# Patient Record
Sex: Female | Born: 1965 | Race: White | Hispanic: No | Marital: Married
Health system: Southern US, Community
[De-identification: ages and names within clinical notes are randomized; demographics above are authoritative.]

## PROBLEM LIST (undated history)

## (undated) DIAGNOSIS — N83209 Unspecified ovarian cyst, unspecified side: Secondary | ICD-10-CM

## (undated) HISTORY — DX: Unspecified ovarian cyst, unspecified side: N83.209

## (undated) HISTORY — PX: APPENDECTOMY: SHX54

---

## 1998-02-06 ENCOUNTER — Ambulatory Visit (HOSPITAL_COMMUNITY): Admission: RE | Admit: 1998-02-06 | Discharge: 1998-02-06 | Payer: Self-pay | Admitting: Obstetrics and Gynecology

## 1998-03-21 ENCOUNTER — Other Ambulatory Visit: Admission: RE | Admit: 1998-03-21 | Discharge: 1998-03-21 | Payer: Self-pay | Admitting: Obstetrics & Gynecology

## 1998-05-12 ENCOUNTER — Inpatient Hospital Stay (HOSPITAL_COMMUNITY): Admission: AD | Admit: 1998-05-12 | Discharge: 1998-05-14 | Payer: Self-pay | Admitting: Obstetrics and Gynecology

## 1999-01-11 ENCOUNTER — Other Ambulatory Visit: Admission: RE | Admit: 1999-01-11 | Discharge: 1999-01-11 | Payer: Self-pay | Admitting: Obstetrics and Gynecology

## 2001-12-04 ENCOUNTER — Other Ambulatory Visit: Admission: RE | Admit: 2001-12-04 | Discharge: 2001-12-04 | Payer: Self-pay | Admitting: Family Medicine

## 2003-03-28 ENCOUNTER — Ambulatory Visit (HOSPITAL_COMMUNITY): Admission: RE | Admit: 2003-03-28 | Discharge: 2003-03-28 | Payer: Self-pay | Admitting: Family Medicine

## 2003-03-28 ENCOUNTER — Encounter: Payer: Self-pay | Admitting: Family Medicine

## 2003-12-19 ENCOUNTER — Inpatient Hospital Stay (HOSPITAL_COMMUNITY): Admission: EM | Admit: 2003-12-19 | Discharge: 2003-12-21 | Payer: Self-pay | Admitting: *Deleted

## 2004-08-29 ENCOUNTER — Ambulatory Visit (HOSPITAL_COMMUNITY): Admission: RE | Admit: 2004-08-29 | Discharge: 2004-08-29 | Payer: Self-pay | Admitting: Unknown Physician Specialty

## 2005-04-03 ENCOUNTER — Ambulatory Visit (HOSPITAL_COMMUNITY): Admission: RE | Admit: 2005-04-03 | Discharge: 2005-04-03 | Payer: Self-pay | Admitting: Family Medicine

## 2005-06-06 ENCOUNTER — Ambulatory Visit: Payer: Self-pay | Admitting: Family Medicine

## 2006-07-13 ENCOUNTER — Emergency Department (HOSPITAL_COMMUNITY): Admission: EM | Admit: 2006-07-13 | Discharge: 2006-07-13 | Payer: Self-pay | Admitting: Emergency Medicine

## 2007-09-29 ENCOUNTER — Emergency Department (HOSPITAL_COMMUNITY): Admission: EM | Admit: 2007-09-29 | Discharge: 2007-09-30 | Payer: Self-pay | Admitting: Emergency Medicine

## 2009-02-16 ENCOUNTER — Ambulatory Visit (HOSPITAL_COMMUNITY): Admission: RE | Admit: 2009-02-16 | Discharge: 2009-02-16 | Payer: Self-pay | Admitting: Obstetrics & Gynecology

## 2009-09-10 ENCOUNTER — Emergency Department (HOSPITAL_COMMUNITY): Admission: EM | Admit: 2009-09-10 | Discharge: 2009-09-10 | Payer: Self-pay | Admitting: Emergency Medicine

## 2010-11-16 ENCOUNTER — Ambulatory Visit (HOSPITAL_COMMUNITY): Admission: RE | Admit: 2010-11-16 | Discharge: 2010-11-16 | Payer: Self-pay | Admitting: Obstetrics and Gynecology

## 2011-01-06 ENCOUNTER — Encounter: Payer: Self-pay | Admitting: Family Medicine

## 2011-01-06 ENCOUNTER — Encounter: Payer: Self-pay | Admitting: General Surgery

## 2011-03-22 LAB — DIFFERENTIAL
Basophils Absolute: 0 10*3/uL (ref 0.0–0.1)
Eosinophils Relative: 2 % (ref 0–5)
Lymphocytes Relative: 31 % (ref 12–46)
Monocytes Absolute: 0.4 10*3/uL (ref 0.1–1.0)

## 2011-03-22 LAB — COMPREHENSIVE METABOLIC PANEL
AST: 26 U/L (ref 0–37)
Albumin: 4.3 g/dL (ref 3.5–5.2)
Alkaline Phosphatase: 44 U/L (ref 39–117)
Chloride: 105 mEq/L (ref 96–112)
Creatinine, Ser: 0.68 mg/dL (ref 0.4–1.2)
GFR calc Af Amer: >60 mL/min (ref >60)
Potassium: 3.7 mEq/L (ref 3.5–5.1)
Sodium: 139 mEq/L (ref 135–145)
Total Bilirubin: 0.7 mg/dL (ref 0.3–1.2)

## 2011-03-22 LAB — URINE MICROSCOPIC-ADD ON

## 2011-03-22 LAB — URINALYSIS, ROUTINE W REFLEX MICROSCOPIC
Glucose, UA: NEGATIVE mg/dL
Hgb urine dipstick: NEGATIVE
Specific Gravity, Urine: 1.01 (ref 1.005–1.030)

## 2011-03-22 LAB — CBC
Platelets: 247 10*3/uL (ref 150–400)
WBC: 5.9 10*3/uL (ref 4.0–10.5)

## 2011-05-03 NOTE — H&P (Signed)
Brooke Reed, Brooke Reed                        ACCOUNT NO.:  [id number]   MEDICAL RECORD NO.:  [id number]                   PATIENT TYPE:  EMS   LOCATION:  ED                                   FACILITY:  [facility]   PHYSICIAN:  Barbaraann Barthel, M.D.              DATE OF BIRTH:  25-Jun-1966   DATE OF ADMISSION:  12/18/2003  DATE OF DISCHARGE:                                HISTORY & PHYSICAL   REASON FOR ADMISSION:  Note, surgery was asked to see this 45 year old white  female for abdominal pain.   CHIEF COMPLAINT:  Abdominal pain, epigastric and right lower quadrant with  nausea and vomiting.   HISTORY OF PRESENT ILLNESS:  The patient states that she was feeling fine  until about 6:00 this afternoon when she developed some discomfort in her  right inner epigastrium that later localized to her right lower quadrant.  This was later accompanied with nausea and vomiting. As this pain did not  abate after she took some Pepto-Bismol, and she continued to have right  lower quadrant pain and she came to the emergency room. She was seen and  evaluated. She was noted on CT scan to have changes suggestive of acute  appendicitis. Surgery was consulted and responded immediately.   PHYSICAL EXAMINATION:  GENERAL:  A pleasant, 45 year old white female who is  uncomfortable, but in no acute distress.  VITAL SIGNS:  She is approximately 5 feet 4 inches and weighs 110 pounds.  Temperature was 97.6, blood pressure 149/74, heart rate 77 per minute and  respirations 20 per minute.  HEENT:  Head is normocephalic.  Eyes - Extraocular movements are intact.  Pupils are round and reactive to light and accommodation. There is no  conjunctive pallor or scleral injection. Sclerae is normal.  Nose is normal.  Mucosa is moist.  NECK:  Supple and cylindrical without jugular vein distention, thyromegaly  or tracheal deviation. There are no bruit auscultated and there is no  adenopathy.  LUNGS:  Chest is clear, both  anteriorly and posteriorly to auscultation.  CARDIAC:  Heart regular rhythm.  BREASTS/AXILLAE:  Without masses.  ABDOMEN:  Bowel sounds are diminished. The patient is tender in her  epigastrium and primarily in the right lower quadrant with localized rebound  tenderness there. There are no femoral or inguinal herniae appreciated.  RECTAL:  Examination is not performed. The patient is currently having her  menses and there is blood around her anal area, so it would not be helpful  for guaiac testing.  EXTREMITIES:  Within normal limits.   REVIEW OF SYMPTOMS:  GI:  Nausea, vomiting and anorexia. No past history of  changes in her bowel movements, no history of bright red rectal bleeding or  black tarry stools. No history of inflammatory bowel disease or hepatitis.  No history of unexplained weight loss.  GU:  No history of dysuria, no history of nephrolithiasis.  CARDIORESPIRATORY:  The  patient smokes a pack of cigarettes per day. She  states she had some chest pain, atypical, associated with putting together a  game station - she states that she has never had any other recurrent  episodes of this.  An EKG was done in the emergency room and this was normal. The patient has  no chest pain or shortness of breath. This appears to be an atypical kind of  story related to the anxieties of Christmas activities. ENDOCRINE:  No  history of diabetes or thyroid disease.  OB/GYN:  She is a gravida 1, para  1, abortus 0, cesarean 0 female whose is currently menstruating. Her  paternal grandmother had breast carcinoma. MUSCULOSKELETAL:  Within normal  limits.   LABORATORY AND ACCESSORY DATA:  The patient has a white count of 13,000 with  an H&H of 11.4 and 33.1 with a left shift.  Electrolytes are all grossly  within normal limits; blood sugar is 120, BUN is 5.0 and liver function  studies are all within normal limits. Amylase and lipase are not elevated,  and urine HCG is negative.   CT scan  shows changes consistent with acute appendicitis. Urinalysis shows  blood - large amounts, this was however, as stated, this patient is  menstruating.  EKG is normal.   IMPRESSION:  Acute appendicitis.   PLAN:  We will continue hydration and have started antibiotics IV, and we  will plan for an exploratory laparotomy and an appendectomy as soon as  possible. We discussed this in detail with the patient and her mother, and  informed consent was obtained. We discussed complications not limited to but  including bleeding, infection and leaking from her appendiceal stump. All  questions were answered.   The patient takes not medications on a regular basis and has no known  allergies, although, she did develop some pruritus when given Dilaudid in  the emergency room.  Other than that, there is no positive medical history.     ___________________________________________                                         Barbaraann Barthel, M.D.   WB/MEDQ  D:  12/19/2003  T:  12/19/2003  Job:  [id number]   cc:   Dr. Mosetta Putt, Emergency Room

## 2011-05-03 NOTE — Discharge Summary (Signed)
Brooke Reed, Brooke Reed                        ACCOUNT NO.:  [id number]   MEDICAL RECORD NO.:  [id number]                   PATIENT TYPE:  INP   LOCATION:  A302                                 FACILITY:  [facility]   PHYSICIAN:  Barbaraann Barthel, M.D.              DATE OF BIRTH:  1966-06-11   DATE OF ADMISSION:  12/18/2003  DATE OF DISCHARGE:  12/21/2003                                 DISCHARGE SUMMARY   DIAGNOSIS:  Acute appendicitis.   PROCEDURE:  On December 19, 2003, open appendectomy.   NOTE:  This is a 45 year old white female who was seen in the emergency room  with right lower quadrant pain, nausea and vomiting.  Her CT scan was  positive for acute appendicitis.  She was taken to surgery after hydration  and antibiotic therapy was initiated and an appendectomy was performed.   GROSS OPERATIVE FINDINGS:  The patient had those consistent with acute  suppurative nonperforated appendicitis.  The rest of the right lower  quadrant appeared to be normal.   HOSPITAL COURSE:  Completely uneventful.  The patient's diet and activity  were advanced as tolerated.  She did have very heavy menses from  postoperative hydration.  Her H&H did drop, however on follow-up this  remained stable.  Her H&H was 8.3 and 24.8 following surgery and repeat H&H  was 9.1 and 27.2 and her blood pressure and vital signs all remained stable.   At time of discharge she was tolerating p.o. well, her wound was clean  without sign of infection, her abdomen was soft and she was voiding well.  She has no leg pain or shortness of breath or chest pain.   DISCHARGE INSTRUCTIONS:  1. Darvocet N-100 one tab p.o. q.4 hours p.r.n. pain.  2. Cipro 500 mg p.o. b.i.d.  3. She was told to clean her wound with alcohol three times a day and avoid     taking any Ex-Lax or harsh cathartics.  4. She is excused from work.  5. She is told to do no heavy lifting or driving or sexual activity in the     immediate postoperative  period.  6. Her diet is to be a soft diet and full liquids.  7. We will follow her perioperatively.  8. She was told to contact us should there be any acute changes.     ___________________________________________                                         Barbaraann Barthel, M.D.   WB/MEDQ  D:  12/21/2003  T:  12/21/2003  Job:  [id number]

## 2011-05-03 NOTE — Op Note (Signed)
NAMEJENAFER, WINTERTON                        ACCOUNT NO.:  [id number]   MEDICAL RECORD NO.:  [id number]                   PATIENT TYPE:  EMS   LOCATION:  ED                                   FACILITY:  [facility]   PHYSICIAN:  Barbaraann Barthel, M.D.              DATE OF BIRTH:  [birth year]   DATE OF PROCEDURE:  12/19/2003  DATE OF DISCHARGE:                                 OPERATIVE REPORT   PREOPERATIVE DIAGNOSIS:  Acute appendicitis.   POSTOPERATIVE DIAGNOSIS:  Acute appendicitis.   PROCEDURE:  Open appendectomy.   SURGEON:  Barbaraann Barthel, M.D.   SPECIMEN:  Appendix.   NOTE:  This is a 45 year old, white female who came to the emergency room  with signs and symptoms of acute appendicitis.  This was confirmed by a CT  scan as well.  She was taken to surgery.  Prior to surgery we discussed  complications, not limited to, but including: Bleeding, infection, and  appendiceal stump leak.  Informed consent was obtained.   GROSS OPERATIVE FINDINGS:  Those consistent with an acute suppurative  nonperforated appendicitis.  The rest of the right lower quadrant was  normal.   TECHNIQUE:  The patient was placed in the supine position and after the  adequate administration of general anesthesia via endotracheal intubation  his entire abdomen was prepped with Betadine solution and draped in the  usual manner.  A Foley catheter was aseptically inserted.  A small  transverse incision was carried out in the right lower quadrant.  The skin,  subcutaneous tissue, and the fascia was divided.  The rectus muscle was  retracted medially.  The peritoneum was grasped and opened.  The cecum was  delivered into the wound and the mesoappendix was clamped and ligated with 2-  0 silk and the appendix was amputated with a TA-30 stapling device.  After  checking for hemostasis, we then changed gloves; irrigated the right lower  quadrant and closed the peritoneum with a running #0 Vicryl and the fascia  with  a figure-of-eight #0 Vicryl.   We irrigated the subcu and closed the skin with a stapling device.  Prior to  closure, all sponge, needle, and instrument counts were found to be correct.  Estimated blood loss was minimal.  The patient received 1100 cc of  crystalloids intraoperatively.  No drains were placed; and there were no  complications.     ___________________________________________                                            Barbaraann Barthel, M.D.   WB/MEDQ  D:  12/19/2003  T:  12/19/2003  Job:  [id number]   cc:   Emergency Department

## 2011-08-16 ENCOUNTER — Encounter: Payer: Self-pay | Admitting: Internal Medicine

## 2011-08-21 ENCOUNTER — Other Ambulatory Visit: Payer: Self-pay | Admitting: Obstetrics and Gynecology

## 2011-08-21 DIAGNOSIS — N6459 Other signs and symptoms in breast: Secondary | ICD-10-CM

## 2011-08-21 DIAGNOSIS — N6321 Unspecified lump in the left breast, upper outer quadrant: Secondary | ICD-10-CM

## 2011-08-23 ENCOUNTER — Other Ambulatory Visit: Payer: Self-pay

## 2011-09-06 ENCOUNTER — Ambulatory Visit: Payer: Self-pay | Admitting: Internal Medicine

## 2011-09-26 LAB — BASIC METABOLIC PANEL
CO2: 25
Calcium: 9.4
Chloride: 108
Creatinine, Ser: 0.77
Glucose, Bld: 106 — ABNORMAL HIGH

## 2011-09-26 LAB — PREGNANCY, URINE: Preg Test, Ur: NEGATIVE

## 2011-09-26 LAB — DIFFERENTIAL
Basophils Absolute: 0
Basophils Relative: 0
Eosinophils Absolute: 0.1
Monocytes Absolute: 0.5
Neutro Abs: 5.7
Neutrophils Relative %: 74

## 2011-09-26 LAB — POCT CARDIAC MARKERS
CKMB, poc: <1 — ABNORMAL LOW
Myoglobin, poc: 24.4
Myoglobin, poc: 24.8
Myoglobin, poc: 28.9
Operator id: 106841
Troponin i, poc: <0.05

## 2011-09-26 LAB — CBC
Hemoglobin: 11.3 — ABNORMAL LOW
MCHC: 34.3
MCV: 92
RDW: 13.6

## 2011-09-26 LAB — RAPID URINE DRUG SCREEN, HOSP PERFORMED
Amphetamines: NOT DETECTED
Barbiturates: NOT DETECTED
Benzodiazepines: NOT DETECTED
Cocaine: NOT DETECTED

## 2012-01-17 ENCOUNTER — Ambulatory Visit
Admission: RE | Admit: 2012-01-17 | Discharge: 2012-01-17 | Disposition: A | Discharge status: Home / Self Care | Payer: BC Managed Care – PPO | Source: Ambulatory Visit | Attending: Obstetrics and Gynecology | Admitting: Obstetrics and Gynecology

## 2012-01-17 ENCOUNTER — Other Ambulatory Visit: Payer: Self-pay | Admitting: Obstetrics and Gynecology

## 2012-01-17 DIAGNOSIS — N6321 Unspecified lump in the left breast, upper outer quadrant: Secondary | ICD-10-CM

## 2012-01-17 DIAGNOSIS — N6459 Other signs and symptoms in breast: Secondary | ICD-10-CM

## 2012-02-05 ENCOUNTER — Ambulatory Visit
Admission: RE | Admit: 2012-02-05 | Discharge: 2012-02-05 | Disposition: A | Discharge status: Home / Self Care | Payer: BC Managed Care – PPO | Source: Ambulatory Visit | Attending: Obstetrics and Gynecology | Admitting: Obstetrics and Gynecology

## 2012-02-05 DIAGNOSIS — N6321 Unspecified lump in the left breast, upper outer quadrant: Secondary | ICD-10-CM

## 2012-02-05 DIAGNOSIS — N6459 Other signs and symptoms in breast: Secondary | ICD-10-CM

## 2012-10-13 ENCOUNTER — Other Ambulatory Visit: Payer: Self-pay | Admitting: Family Medicine

## 2012-10-13 DIAGNOSIS — Z1231 Encounter for screening mammogram for malignant neoplasm of breast: Secondary | ICD-10-CM

## 2012-10-15 ENCOUNTER — Ambulatory Visit: Payer: BC Managed Care – PPO | Attending: Family | Admitting: Physical Therapy

## 2013-01-18 ENCOUNTER — Ambulatory Visit: Payer: BC Managed Care – PPO

## 2013-05-03 ENCOUNTER — Ambulatory Visit: Payer: BC Managed Care – PPO

## 2013-08-24 ENCOUNTER — Other Ambulatory Visit: Payer: Self-pay

## 2013-08-24 DIAGNOSIS — Z1231 Encounter for screening mammogram for malignant neoplasm of breast: Secondary | ICD-10-CM

## 2013-09-13 ENCOUNTER — Ambulatory Visit
Admission: RE | Admit: 2013-09-13 | Discharge: 2013-09-13 | Disposition: A | Discharge status: Home / Self Care | Payer: BC Managed Care – PPO | Source: Ambulatory Visit

## 2013-09-13 DIAGNOSIS — Z1231 Encounter for screening mammogram for malignant neoplasm of breast: Secondary | ICD-10-CM

## 2013-12-02 ENCOUNTER — Other Ambulatory Visit: Payer: Self-pay | Admitting: Obstetrics and Gynecology

## 2013-12-02 DIAGNOSIS — N6001 Solitary cyst of right breast: Secondary | ICD-10-CM

## 2013-12-02 DIAGNOSIS — N644 Mastodynia: Secondary | ICD-10-CM

## 2013-12-10 ENCOUNTER — Other Ambulatory Visit: Payer: BC Managed Care – PPO

## 2013-12-20 ENCOUNTER — Ambulatory Visit
Admission: RE | Admit: 2013-12-20 | Discharge: 2013-12-20 | Disposition: A | Discharge status: Home / Self Care | Payer: BC Managed Care – PPO | Source: Ambulatory Visit | Attending: Obstetrics and Gynecology | Admitting: Obstetrics and Gynecology

## 2013-12-20 DIAGNOSIS — N644 Mastodynia: Secondary | ICD-10-CM

## 2013-12-20 DIAGNOSIS — N6001 Solitary cyst of right breast: Secondary | ICD-10-CM

## 2014-05-10 ENCOUNTER — Other Ambulatory Visit (HOSPITAL_BASED_OUTPATIENT_CLINIC_OR_DEPARTMENT_OTHER): Payer: Self-pay | Admitting: Family Medicine

## 2014-05-10 DIAGNOSIS — R1011 Right upper quadrant pain: Secondary | ICD-10-CM

## 2014-05-11 ENCOUNTER — Ambulatory Visit (HOSPITAL_BASED_OUTPATIENT_CLINIC_OR_DEPARTMENT_OTHER)
Admission: RE | Admit: 2014-05-11 | Discharge: 2014-05-11 | Disposition: A | Discharge status: Home / Self Care | Payer: BC Managed Care – PPO | Source: Ambulatory Visit | Attending: Family Medicine | Admitting: Family Medicine

## 2014-05-11 DIAGNOSIS — R1011 Right upper quadrant pain: Secondary | ICD-10-CM | POA: Insufficient documentation

## 2014-05-11 DIAGNOSIS — R110 Nausea: Secondary | ICD-10-CM | POA: Insufficient documentation

## 2014-05-17 ENCOUNTER — Other Ambulatory Visit (HOSPITAL_BASED_OUTPATIENT_CLINIC_OR_DEPARTMENT_OTHER): Payer: Self-pay | Admitting: Family Medicine

## 2014-05-17 DIAGNOSIS — R1011 Right upper quadrant pain: Secondary | ICD-10-CM

## 2014-05-18 ENCOUNTER — Ambulatory Visit (HOSPITAL_BASED_OUTPATIENT_CLINIC_OR_DEPARTMENT_OTHER)
Admission: RE | Admit: 2014-05-18 | Discharge: 2014-05-18 | Disposition: A | Discharge status: Home / Self Care | Payer: BC Managed Care – PPO | Source: Ambulatory Visit | Attending: Family Medicine | Admitting: Family Medicine

## 2014-05-18 DIAGNOSIS — R1011 Right upper quadrant pain: Secondary | ICD-10-CM | POA: Insufficient documentation

## 2014-05-18 MED ORDER — IOHEXOL 300 MG/ML  SOLN
100.0000 mL | Freq: Once | INTRAMUSCULAR | Status: AC | PRN
  Administered 2014-05-18: 100 mL via INTRAVENOUS

## 2014-06-01 ENCOUNTER — Other Ambulatory Visit (HOSPITAL_COMMUNITY): Payer: Self-pay | Admitting: Gastroenterology

## 2014-06-01 DIAGNOSIS — R52 Pain, unspecified: Secondary | ICD-10-CM

## 2014-06-08 ENCOUNTER — Encounter (HOSPITAL_COMMUNITY)
Admission: RE | Admit: 2014-06-08 | Discharge: 2014-06-08 | Disposition: A | Discharge status: Home / Self Care | Payer: BC Managed Care – PPO | Source: Ambulatory Visit | Attending: Gastroenterology | Admitting: Gastroenterology

## 2014-06-08 DIAGNOSIS — R52 Pain, unspecified: Secondary | ICD-10-CM | POA: Insufficient documentation

## 2014-06-08 MED ORDER — TECHNETIUM TC 99M MEBROFENIN IV KIT
5.0000 | PACK | Freq: Once | INTRAVENOUS | Status: AC | PRN
  Administered 2014-06-08: 5 via INTRAVENOUS

## 2014-06-08 MED ORDER — SINCALIDE 5 MCG IJ SOLR
0.0200 ug/kg | Freq: Once | INTRAMUSCULAR | Status: DC
  Administered 2014-06-08: 1.02 ug via INTRAVENOUS

## 2014-06-08 MED ORDER — SINCALIDE 5 MCG IJ SOLR
INTRAMUSCULAR | Status: AC
  Administered 2014-06-08: 1.02 ug via INTRAVENOUS
  Filled 2014-06-08: qty 5

## 2014-06-20 ENCOUNTER — Ambulatory Visit (HOSPITAL_COMMUNITY): Payer: BC Managed Care – PPO

## 2015-02-01 ENCOUNTER — Other Ambulatory Visit: Payer: Self-pay

## 2015-02-01 DIAGNOSIS — Z1231 Encounter for screening mammogram for malignant neoplasm of breast: Secondary | ICD-10-CM

## 2015-02-08 ENCOUNTER — Ambulatory Visit
Admission: RE | Admit: 2015-02-08 | Discharge: 2015-02-08 | Disposition: A | Discharge status: Home / Self Care | Payer: BLUE CROSS/BLUE SHIELD | Source: Ambulatory Visit

## 2015-02-08 DIAGNOSIS — Z1231 Encounter for screening mammogram for malignant neoplasm of breast: Secondary | ICD-10-CM

## 2015-03-13 IMAGING — CT CT ABD-PELV W/ CM
2 of 5 series · 17 of 46 positions shown, 19 images · IV contrast (APPLIED)
Comparison: Ultrasound 05/11/2014.  CT 09/10/2009

CLINICAL DATA: Right upper quadrant pain.  Nausea.

EXAM:
CT ABDOMEN AND PELVIS WITH CONTRAST
TECHNIQUE: Multidetector CT imaging of the abdomen and pelvis was performed
using the standard protocol following bolus administration of
intravenous contrast.
CONTRAST:  100mL OMNIPAQUE IOHEXOL 300 MG/ML  SOLN

[Series 2: abd/pelvis 5.0 b31f · axial · 0.67mm/px · z∈[+866,+1226]mm · 14 of 82 slices shown, 16 images]
[im 5/82  soft-tissue]
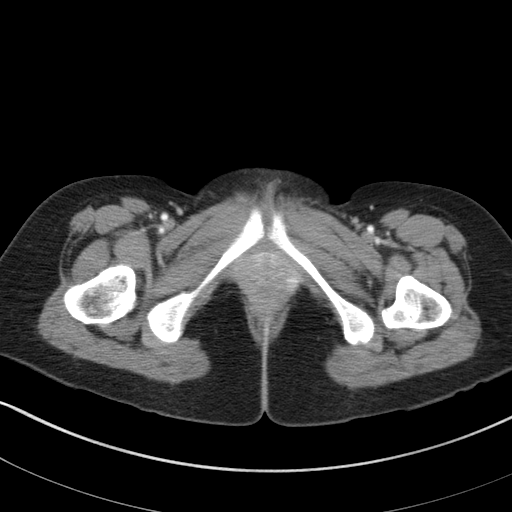
[im 5/82  bone]
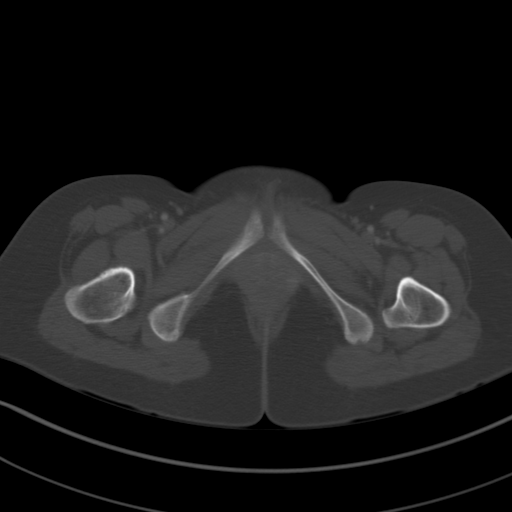
[im 13/82  soft-tissue]
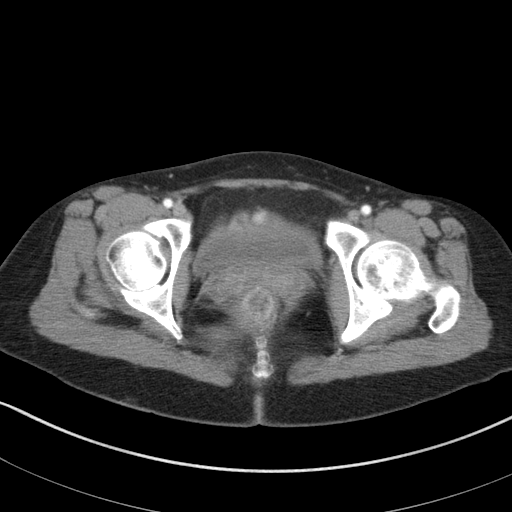
[im 17/82  soft-tissue]
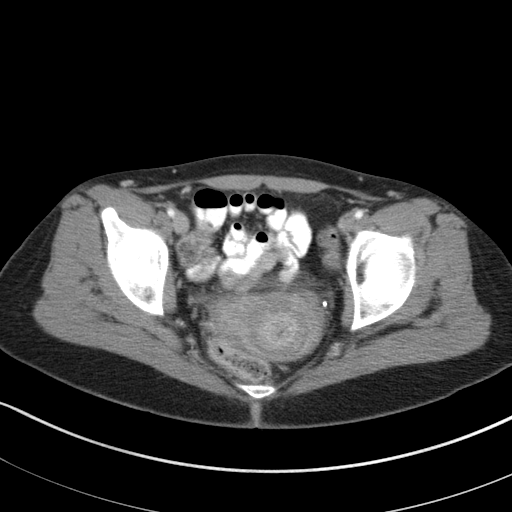
[im 21/82  soft-tissue]
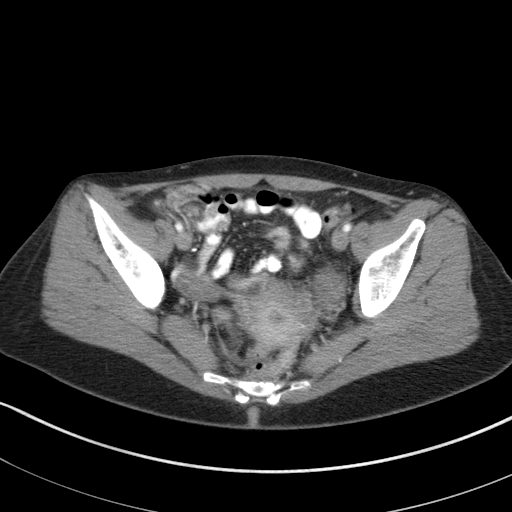
[im 29/82  soft-tissue]
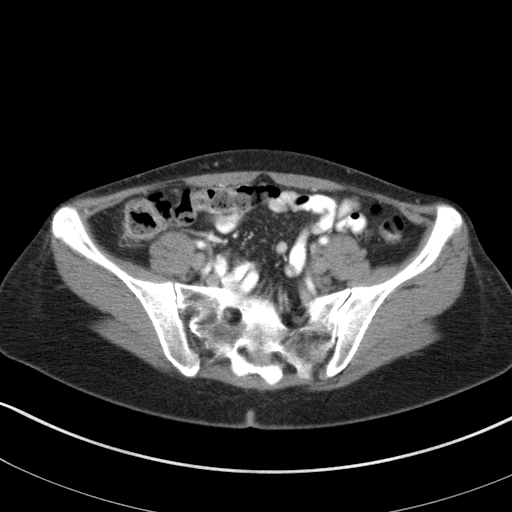
[im 33/82  soft-tissue]
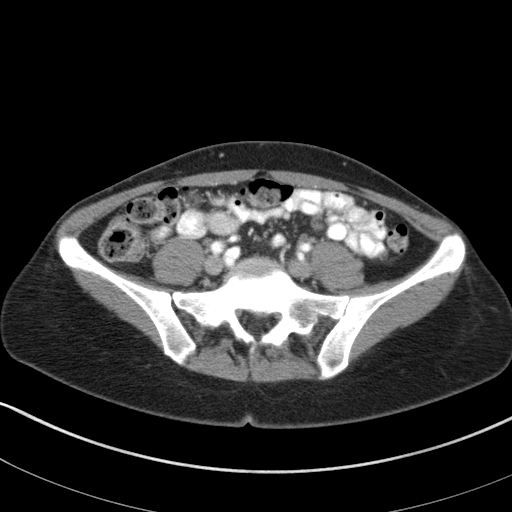
[im 37/82  soft-tissue]
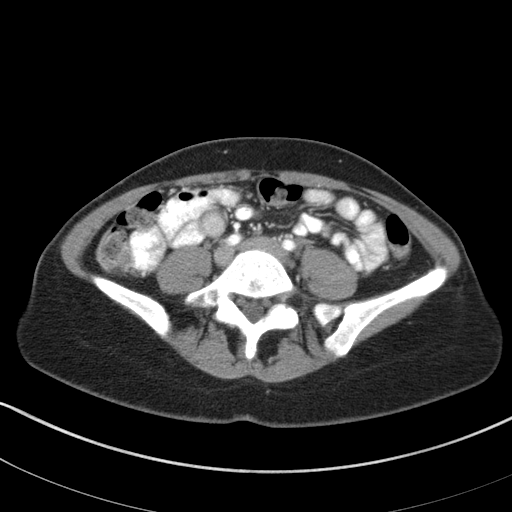
[im 45/82  soft-tissue]
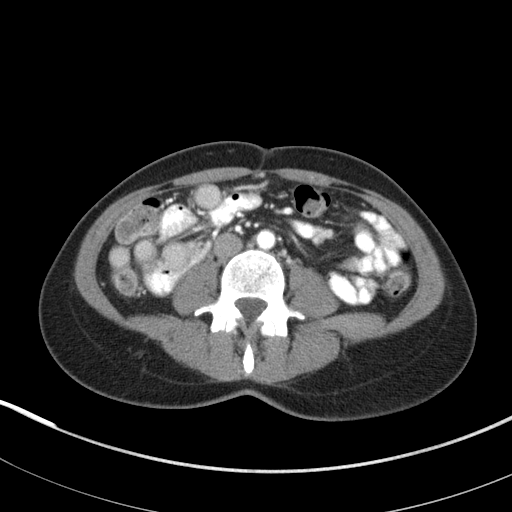
[im 49/82  soft-tissue]
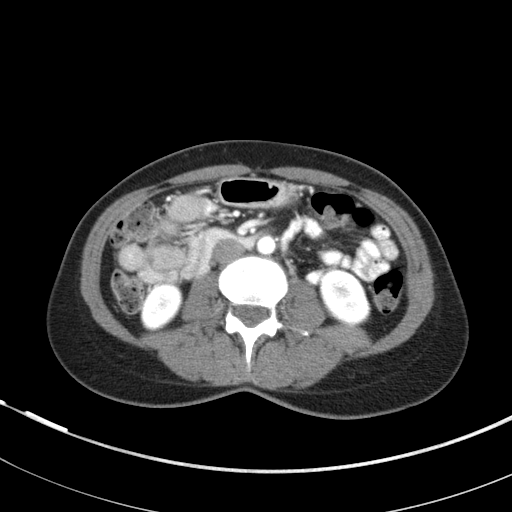
[im 49/82  bone]
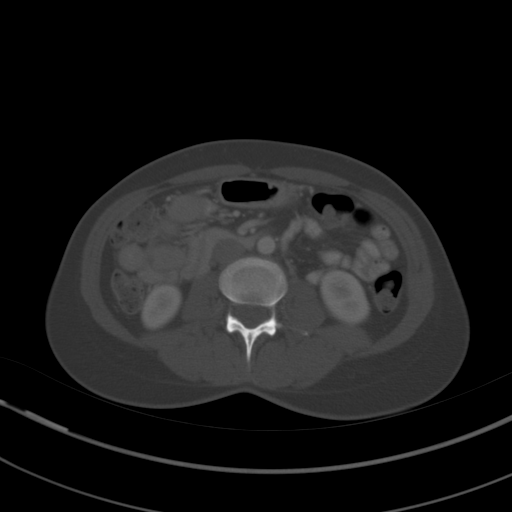
[im 53/82  soft-tissue]
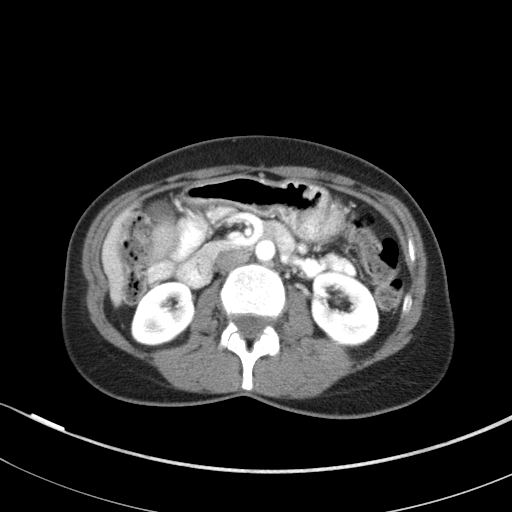
[im 61/82  soft-tissue]
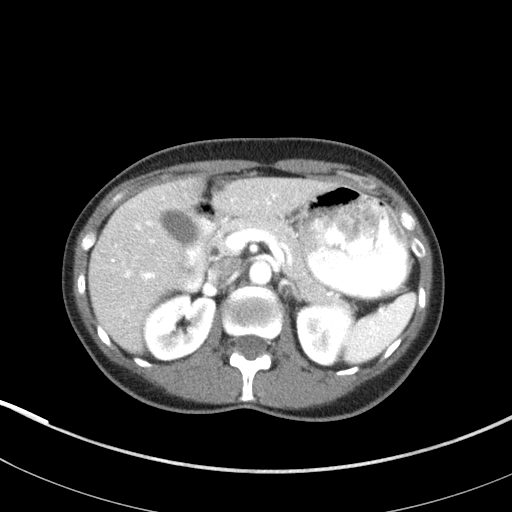
[im 65/82  soft-tissue]
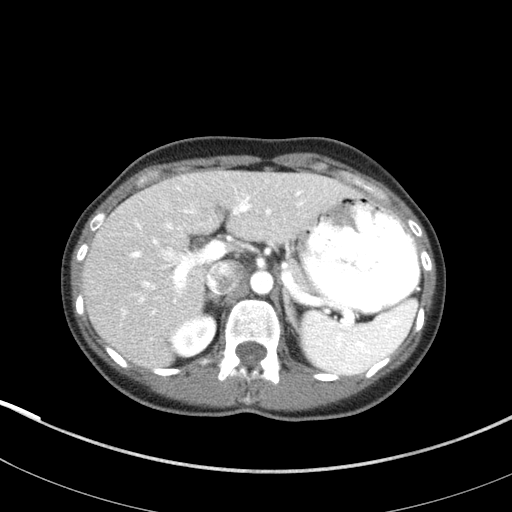
[im 69/82  soft-tissue]
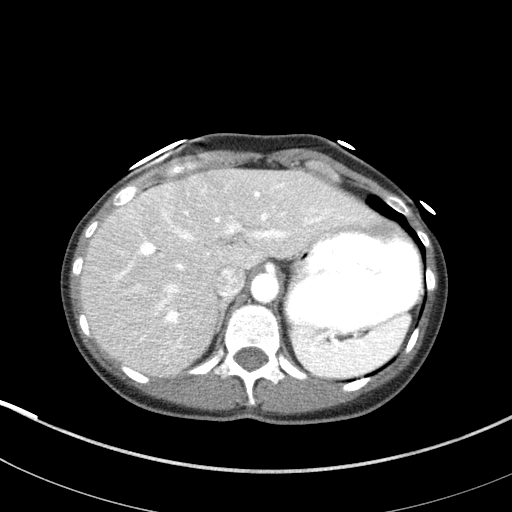
[im 77/82  soft-tissue]
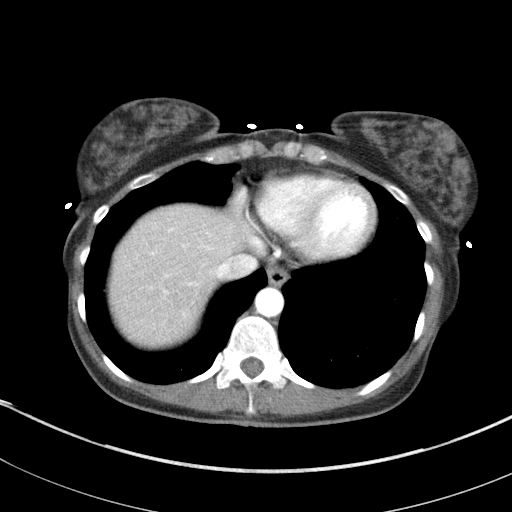

[Series 5: abd/pelvis 3.0 coronal · coronal · 0.67mm/px · 3 of 69 slices shown]
[im 23/69  soft-tissue]
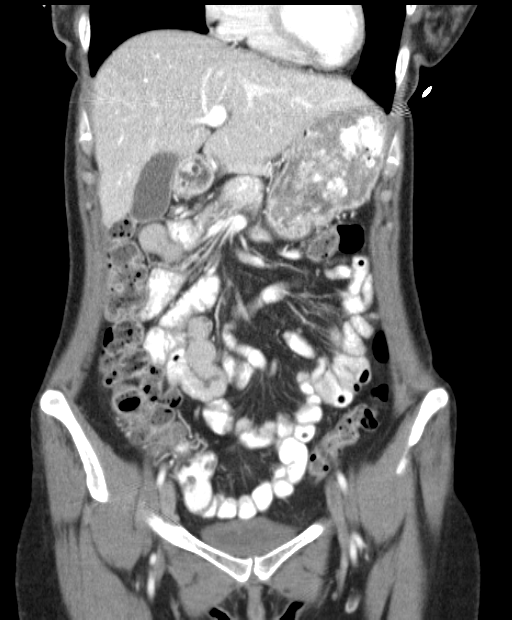
[im 31/69  soft-tissue]
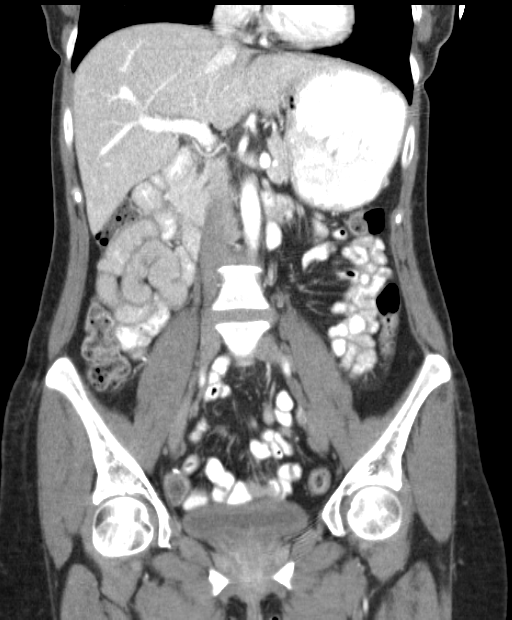
[im 38/69  soft-tissue]
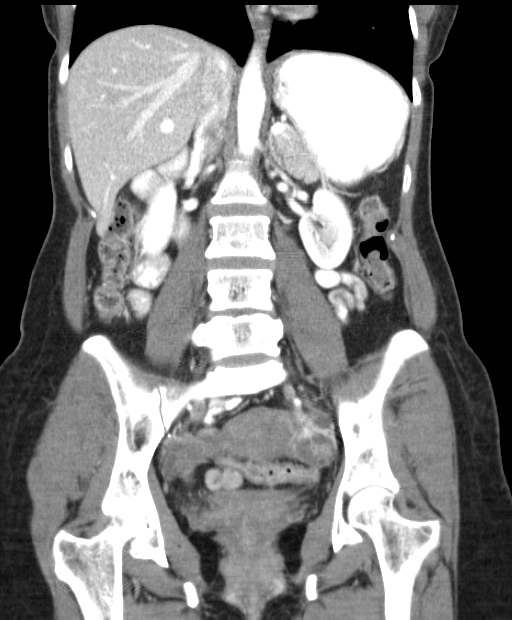

[17 of 46 positions shown; findings below may reference images not displayed]

FINDINGS: Lung bases are clear.  No effusions.  Heart is normal size.

Liver, gallbladder, spleen, pancreas, adrenals and kidneys are
normal. Uterus, adnexae and urinary bladder unremarkable. Small
follicles in the ovaries bilaterally.

Stomach, large and small bowel unremarkable. Trace free fluid in the
cul-de-sac. No free air or adenopathy. Aorta is normal caliber.

No acute bony abnormality or focal bone lesion.
IMPRESSION: Unremarkable study.

## 2015-11-01 ENCOUNTER — Other Ambulatory Visit: Payer: Self-pay | Admitting: Obstetrics and Gynecology

## 2015-11-01 DIAGNOSIS — N6310 Unspecified lump in the right breast, unspecified quadrant: Secondary | ICD-10-CM

## 2015-11-07 ENCOUNTER — Other Ambulatory Visit: Payer: Self-pay | Admitting: Obstetrics and Gynecology

## 2015-11-07 DIAGNOSIS — Z803 Family history of malignant neoplasm of breast: Secondary | ICD-10-CM

## 2015-11-08 ENCOUNTER — Ambulatory Visit
Admission: RE | Admit: 2015-11-08 | Discharge: 2015-11-08 | Disposition: A | Discharge status: Home / Self Care | Payer: BLUE CROSS/BLUE SHIELD | Source: Ambulatory Visit | Attending: Obstetrics and Gynecology | Admitting: Obstetrics and Gynecology

## 2015-11-08 ENCOUNTER — Other Ambulatory Visit: Payer: Self-pay | Admitting: Obstetrics and Gynecology

## 2015-11-08 DIAGNOSIS — N6310 Unspecified lump in the right breast, unspecified quadrant: Secondary | ICD-10-CM

## 2015-11-08 DIAGNOSIS — N6001 Solitary cyst of right breast: Secondary | ICD-10-CM

## 2015-11-16 ENCOUNTER — Ambulatory Visit
Admission: RE | Admit: 2015-11-16 | Discharge: 2015-11-16 | Disposition: A | Discharge status: Home / Self Care | Payer: BLUE CROSS/BLUE SHIELD | Source: Ambulatory Visit | Attending: Obstetrics and Gynecology | Admitting: Obstetrics and Gynecology

## 2015-11-16 DIAGNOSIS — N6001 Solitary cyst of right breast: Secondary | ICD-10-CM

## 2015-12-05 ENCOUNTER — Other Ambulatory Visit: Payer: Self-pay | Admitting: Obstetrics and Gynecology

## 2015-12-05 DIAGNOSIS — Z1231 Encounter for screening mammogram for malignant neoplasm of breast: Secondary | ICD-10-CM

## 2016-02-12 ENCOUNTER — Ambulatory Visit: Payer: BLUE CROSS/BLUE SHIELD

## 2016-02-13 ENCOUNTER — Other Ambulatory Visit: Payer: BLUE CROSS/BLUE SHIELD

## 2016-03-12 ENCOUNTER — Ambulatory Visit
Admission: RE | Admit: 2016-03-12 | Discharge: 2016-03-12 | Disposition: A | Discharge status: Home / Self Care | Payer: BLUE CROSS/BLUE SHIELD | Source: Ambulatory Visit | Attending: Obstetrics and Gynecology | Admitting: Obstetrics and Gynecology

## 2016-03-12 DIAGNOSIS — Z1231 Encounter for screening mammogram for malignant neoplasm of breast: Secondary | ICD-10-CM

## 2016-03-13 ENCOUNTER — Ambulatory Visit
Admission: RE | Admit: 2016-03-13 | Discharge: 2016-03-13 | Disposition: A | Discharge status: Home / Self Care | Payer: BLUE CROSS/BLUE SHIELD | Source: Ambulatory Visit | Attending: Obstetrics and Gynecology | Admitting: Obstetrics and Gynecology

## 2016-03-13 DIAGNOSIS — Z803 Family history of malignant neoplasm of breast: Secondary | ICD-10-CM

## 2016-03-13 MED ORDER — GADOBENATE DIMEGLUMINE 529 MG/ML IV SOLN
10.0000 mL | Freq: Once | INTRAVENOUS | Status: AC | PRN
  Administered 2016-03-13: 10 mL via INTRAVENOUS

## 2018-12-11 ENCOUNTER — Other Ambulatory Visit: Payer: Self-pay | Admitting: Obstetrics and Gynecology

## 2018-12-11 DIAGNOSIS — Z1231 Encounter for screening mammogram for malignant neoplasm of breast: Secondary | ICD-10-CM

## 2018-12-18 ENCOUNTER — Other Ambulatory Visit: Payer: Self-pay | Admitting: Family Medicine

## 2018-12-18 DIAGNOSIS — N649 Disorder of breast, unspecified: Secondary | ICD-10-CM

## 2018-12-18 DIAGNOSIS — N644 Mastodynia: Secondary | ICD-10-CM

## 2019-01-13 ENCOUNTER — Ambulatory Visit: Payer: BLUE CROSS/BLUE SHIELD

## 2019-01-14 ENCOUNTER — Other Ambulatory Visit: Payer: Self-pay | Admitting: Family Medicine

## 2019-01-14 DIAGNOSIS — N644 Mastodynia: Secondary | ICD-10-CM

## 2019-01-14 DIAGNOSIS — N649 Disorder of breast, unspecified: Secondary | ICD-10-CM

## 2019-01-18 ENCOUNTER — Other Ambulatory Visit: Payer: BLUE CROSS/BLUE SHIELD

## 2019-01-25 ENCOUNTER — Ambulatory Visit
Admission: RE | Admit: 2019-01-25 | Discharge: 2019-01-25 | Disposition: A | Discharge status: Home / Self Care | Payer: BLUE CROSS/BLUE SHIELD | Source: Ambulatory Visit | Attending: Family Medicine | Admitting: Family Medicine

## 2019-01-25 DIAGNOSIS — N649 Disorder of breast, unspecified: Secondary | ICD-10-CM

## 2019-01-25 DIAGNOSIS — N644 Mastodynia: Secondary | ICD-10-CM

## 2020-08-25 ENCOUNTER — Other Ambulatory Visit: Payer: Self-pay | Admitting: Family Medicine

## 2020-08-25 DIAGNOSIS — Z1231 Encounter for screening mammogram for malignant neoplasm of breast: Secondary | ICD-10-CM

## 2020-08-29 ENCOUNTER — Other Ambulatory Visit: Payer: Self-pay | Admitting: Family Medicine

## 2020-08-29 DIAGNOSIS — R928 Other abnormal and inconclusive findings on diagnostic imaging of breast: Secondary | ICD-10-CM

## 2020-08-29 DIAGNOSIS — N63 Unspecified lump in unspecified breast: Secondary | ICD-10-CM

## 2020-09-14 ENCOUNTER — Other Ambulatory Visit: Payer: Self-pay | Admitting: Family Medicine

## 2020-09-14 DIAGNOSIS — N6320 Unspecified lump in the left breast, unspecified quadrant: Secondary | ICD-10-CM

## 2020-10-04 ENCOUNTER — Other Ambulatory Visit: Payer: BLUE CROSS/BLUE SHIELD

## 2020-10-18 ENCOUNTER — Other Ambulatory Visit: Payer: BLUE CROSS/BLUE SHIELD

## 2023-02-06 ENCOUNTER — Inpatient Hospital Stay: Payer: Self-pay

## 2023-02-06 ENCOUNTER — Inpatient Hospital Stay: Payer: Self-pay | Attending: Genetic Counselor | Admitting: Genetic Counselor

## 2023-02-06 NOTE — Progress Notes (Deleted)
REFERRING PROVIDER: ***  PRIMARY PROVIDER:  [place], Betty G, MD  PRIMARY REASON FOR VISIT:  No diagnosis found.   HISTORY OF PRESENT ILLNESS:   Ms. Maki, a 57 y.o. female, was seen for a [place] cancer genetics consultation at the request of Bradly Bienenstock, NP due to a family history of *** cancer.  Ms. Negro presents to clinic today to discuss the possibility of a hereditary predisposition to cancer, to discuss genetic testing, and to further clarify her future cancer risks, as well as potential cancer risks for family members.   Ms. Hanis is a 57 y.o. female with no personal history of cancer.  ***  CANCER HISTORY:  Oncology History   No history exists.     RISK FACTORS:  Mammogram within the last year: yes; category C breast density Breast MRIs: *** Number of breast biopsies: {Numbers 1-12 multi-select:20307}. Colonoscopy: {Yes/No-Ex:120004}; {normal/abnormal/not examined:14677}. Hysterectomy: {Yes/No-Ex:120004}.  Ovaries intact: {Yes/No-Ex:120004}.  Menarche was at age ***.  First live birth at age ***.  Menopausal status: {Menopause:31378}.  OCP use for approximately {Numbers 1-12 multi-select:20307} years.  HRT use: {Numbers 1-12 multi-select:20307} years. Dermatology screening: ***  Past Medical History:  Diagnosis Date   Ovarian cyst     Past Surgical History:  Procedure Laterality Date   APPENDECTOMY        FAMILY HISTORY:  We obtained a detailed, 4-generation family history.  Significant diagnoses are listed below: Family History  Problem Relation Age of Onset   Breast cancer Paternal Grandmother        in 15's    Ms. Stayner is {aware/unaware} of previous family history of genetic testing for hereditary cancer risks. Patient's maternal ancestors are of *** descent, and paternal ancestors are of *** descent. There {IS NO:12509} reported Ashkenazi Jewish ancestry. There {IS NO:12509} known consanguinity.  GENETIC COUNSELING ASSESSMENT: Ms. Reppucci  is a 57 y.o. female with a {Personal/family:20331} history of {cancer/polyps} which is somewhat suggestive of a {DISEASE} and predisposition to cancer given ***. We, therefore, discussed and recommended the following at today's visit.   DISCUSSION: We discussed that *** - ***% of *** is hereditary, with most cases of hereditary *** cancer associated with ***.  There are other genes that can be associated with hereditary *** cancer syndromes.  These include ***.  We discussed that testing is beneficial for several reasons, including knowing about other cancer risks, identifying potential screening and risk-reduction options that may be appropriate, and to understanding if other family members could be at risk for cancer and allowing them to undergo genetic testing.  We reviewed the characteristics, features and inheritance patterns of hereditary cancer syndromes. We also discussed genetic testing, including the appropriate family members to test, the process of testing, insurance coverage and turn-around-time for results. We discussed the implications of a negative, positive, and variant of uncertain significant result. ***We discussed that negative results would be uninformative given that Ms. Swanigan does not have a personal history of cancer. We recommended Ms. Harper pursue genetic testing for a panel that contains genes associated with ***.  Ms. Risi was offered a common hereditary cancer panel (48 genes) and an expanded pan-cancer panel (85 genes). Ms. Mcmorris was informed of the benefits and limitations of each panel, including that expanded pan-cancer panels contain several genes that do not have clear management guidelines at this point in time.  We also discussed that as the number of genes included on a panel increases, the chances of variants of uncertain significance increases.  After considering the benefits and limitations of each gene panel, Ms. Wondra elected to have an *** through ***.    Based on Ms. Resendes's {Personal/family:20331} history of cancer, she meets medical criteria for genetic testing. Despite that she meets criteria, she may still have an out of pocket cost. We discussed that if her out of pocket cost for testing is over $100, the laboratory should contact them to discuss self-pay options and/or patient pay assistance programs.   ***We reviewed the characteristics, features and inheritance patterns of hereditary cancer syndromes. We also discussed genetic testing, including the appropriate family members to test, the process of testing, insurance coverage and turn-around-time for results. We discussed the implications of a negative, positive and/or variant of uncertain significant result. In order to get genetic test results in a timely manner so that Ms. Cradle can use these genetic test results for surgical decisions, we recommended Ms. Sciortino pursue genetic testing for the ***. Once complete, we recommend Ms. Nguon pursue reflex genetic testing to the *** gene panel.   Based on Ms. Neuzil's {Personal/family:20331} history of cancer, she meets medical criteria for genetic testing. Despite that she meets criteria, she may still have an out of pocket cost.   ***We discussed with Ms. Stofer that the {Personal/family:20331} history does not meet insurance or NCCN criteria for genetic testing and, therefore, is not highly consistent with a familial hereditary cancer syndrome.  We feel she is at low risk to harbor a gene mutation associated with such a condition. Thus, we did not recommend any genetic testing, at this time, and recommended Ms. Leppo continue to follow the cancer screening guidelines given by her primary healthcare provider.  ***In order to estimate her chance of having a {CA GENE:62345} mutation, we used statistical models ({GENMODELS:62370}) that consider her personal medical history, family history and ancestry.  Because each model is different, there can  be a lot of variability in the risks they give.  Therefore, these numbers must be considered a rough range and not a precise risk of having a {CA GENE:62345} mutation.  These models estimate that she has approximately a ***-***% chance of having a mutation. Based on this assessment of her family and personal history, genetic testing {IS/ISNOT:34056} recommended.  ***Based on the patient's {Personal/family:20331} history, a statistical model ({GENMODELS:62370}) was used to estimate her risk of developing {CA HX:54794}. This estimates her lifetime risk of developing {CA HX:54794} to be approximately ***%. This estimation does not consider any genetic testing results.  The patient's lifetime breast cancer risk is a preliminary estimate based on available information using one of several models endorsed by the [place] (ACS). The ACS recommends consideration of breast MRI screening as an adjunct to mammography for patients at high risk (defined as 20% or greater lifetime risk).   ***Ms. Gurecki has been determined to be at high risk for breast cancer.  Therefore, we recommend that annual screening with mammography and breast MRI be performed.  ***begin at age 82, or 10 years prior to the age of breast cancer diagnosis in a relative (whichever is earlier).  We discussed that Ms. Gair should discuss her individual situation with her referring physician and determine a breast cancer screening plan with which they are both comfortable.    We discussed the Genetic Information Non-Discrimination Act (GINA) of 2008, which helps protect individuals against genetic discrimination based on their genetic test results.  It impacts both health insurance and employment.  With health insurance, it protects against  genetic test results being used for increased premiums or policy termination. For employment, it protects against hiring, firing and promoting decisions based on genetic test results.  GINA does not  apply to those in the [employer], those who work for companies with less than 15 employees, and new life insurance or long-term disability insurance policies.  Health status due to a cancer diagnosis is not protected under GINA.  PLAN: After considering the risks, benefits, and limitations, Ms. Farella provided informed consent to pursue genetic testing and the blood sample was sent to {Lab} Laboratories for analysis of the {test}. Results should be available within approximately {TAT TIME} weeks' time, at which point they will be disclosed by telephone to Ms. Diane, as will any additional recommendations warranted by these results. Ms. Galanos will receive a summary of her genetic counseling visit and a copy of her results once available. This information will also be available in Epic.   *** Despite our recommendation, Ms. Ludden did not wish to pursue genetic testing at today's visit. We understand this decision and remain available to coordinate genetic testing at any time in the future. We, therefore, recommend Ms. Healy continue to follow the cancer screening guidelines given by her primary healthcare provider.  ***Based on Ms. Schelling's family history, we recommended her ***, who was diagnosed with *** at age ***, have genetic counseling and testing. Ms. Bueter will let us know if we can be of any assistance in coordinating genetic counseling and/or testing for this family member.   Lastly, we encouraged Ms. Back to remain in contact with cancer genetics annually so that we can continuously update the family history and inform her of any changes in cancer genetics and testing that may be of benefit for this family.   Ms. Carn questions were answered to her satisfaction today. Our contact information was provided should additional questions or concerns arise. ***Thank you for the referral and allowing [place] to share in the care of your patient.   Skie Vitrano M. Joette Catching, [place], [facility] Genetic  Counselor [email or web address] (P) [phone]   The patient was seen for a total of *** minutes in face-to-face genetic counseling.  ***The was patient was accompanied by ***.  ***The patient was seen alone.  Drs. Lindi Adie and/or Burr Medico were available to discuss this case as needed.  _______________________________________________________________________ For Office Staff:  Number of people involved in session: *** Was an Intern/ student involved with case: {YES/NO:63}
